# Patient Record
Sex: Female | Born: 1965 | Race: Black or African American | Hispanic: No | Smoking: Never smoker
Health system: Southern US, Community
[De-identification: ages and names within clinical notes are randomized; demographics above are authoritative.]

## PROBLEM LIST (undated history)

## (undated) DIAGNOSIS — I10 Essential (primary) hypertension: Secondary | ICD-10-CM

## (undated) DIAGNOSIS — E785 Hyperlipidemia, unspecified: Secondary | ICD-10-CM

## (undated) DIAGNOSIS — E119 Type 2 diabetes mellitus without complications: Secondary | ICD-10-CM

## (undated) HISTORY — DX: Hyperlipidemia, unspecified: E78.5

## (undated) HISTORY — DX: Essential (primary) hypertension: I10

## (undated) HISTORY — DX: Type 2 diabetes mellitus without complications: E11.9

## (undated) HISTORY — PX: TONSILLECTOMY: SUR1361

---

## 1998-11-13 DIAGNOSIS — I10 Essential (primary) hypertension: Secondary | ICD-10-CM

## 1998-11-13 HISTORY — DX: Essential (primary) hypertension: I10

## 2005-03-02 ENCOUNTER — Ambulatory Visit: Payer: Self-pay | Admitting: Internal Medicine

## 2006-07-30 ENCOUNTER — Ambulatory Visit: Payer: Self-pay | Admitting: Internal Medicine

## 2007-09-04 ENCOUNTER — Ambulatory Visit: Payer: Self-pay | Admitting: Internal Medicine

## 2008-09-08 ENCOUNTER — Ambulatory Visit: Payer: Self-pay | Admitting: Internal Medicine

## 2009-12-30 ENCOUNTER — Ambulatory Visit: Payer: Self-pay | Admitting: Internal Medicine

## 2011-01-24 ENCOUNTER — Ambulatory Visit: Payer: Self-pay | Admitting: Internal Medicine

## 2012-05-28 ENCOUNTER — Ambulatory Visit: Payer: Self-pay | Admitting: Internal Medicine

## 2013-07-28 ENCOUNTER — Ambulatory Visit: Payer: Self-pay | Admitting: Internal Medicine

## 2013-08-01 ENCOUNTER — Ambulatory Visit: Payer: Self-pay | Admitting: Internal Medicine

## 2013-11-09 IMAGING — MG MM ADDITIONAL VIEWS AT NO CHARGE
2 series · 6 of 6 positions shown · non-contrast
Comparison: 07/28/2013

REASON FOR EXAM: LT BRST CALCIFICATIONS
COMMENTS:

PROCEDURE:     MAM - MAM DGTL ADD VW LT  SCR  - August 01, 2013  [DATE]
CLINICAL DATA: The patient returns to evaluate calcifications in
the left breast.
DIGITAL DIAGNOSTIC LEFT MAMMOGRAM WITH CAD THE

[L ML · left · 4 of 4 slices shown]
[im 1/4]
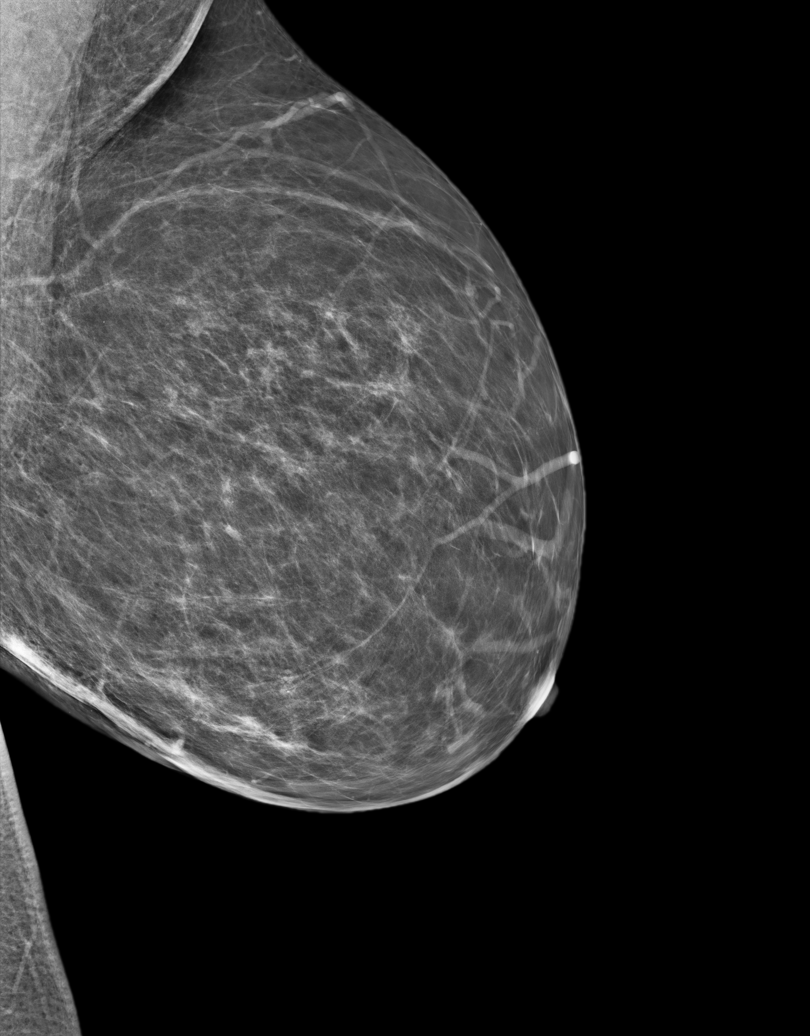
[im 2/4]
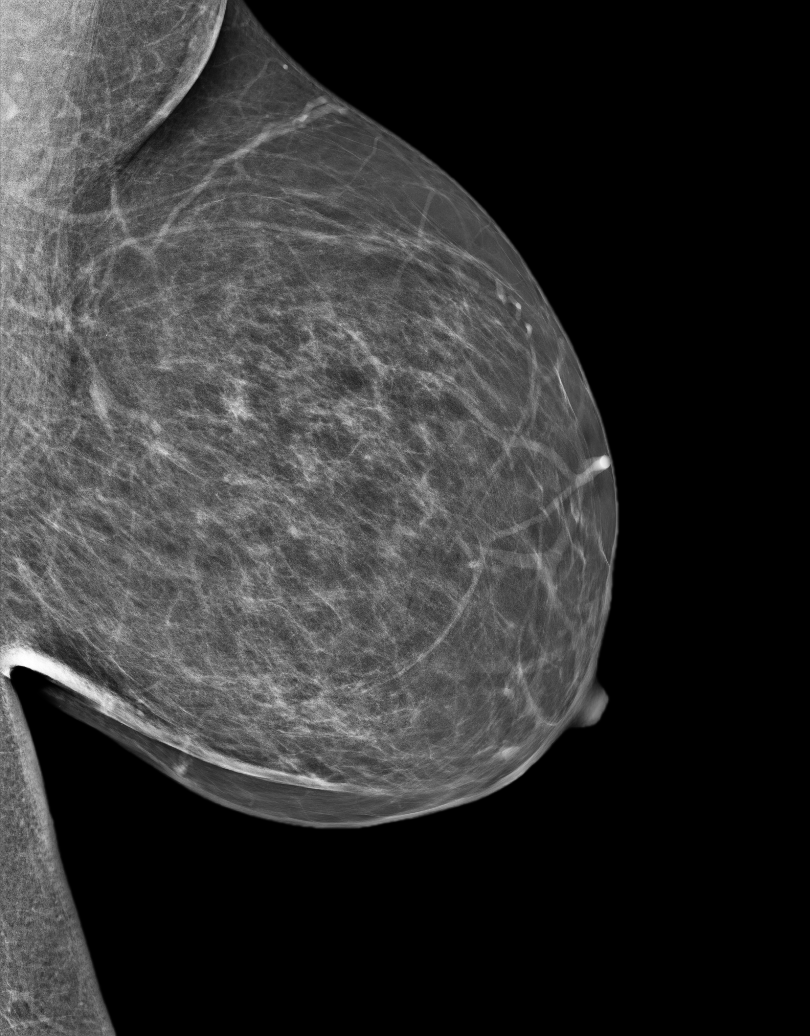
[im 3/4]
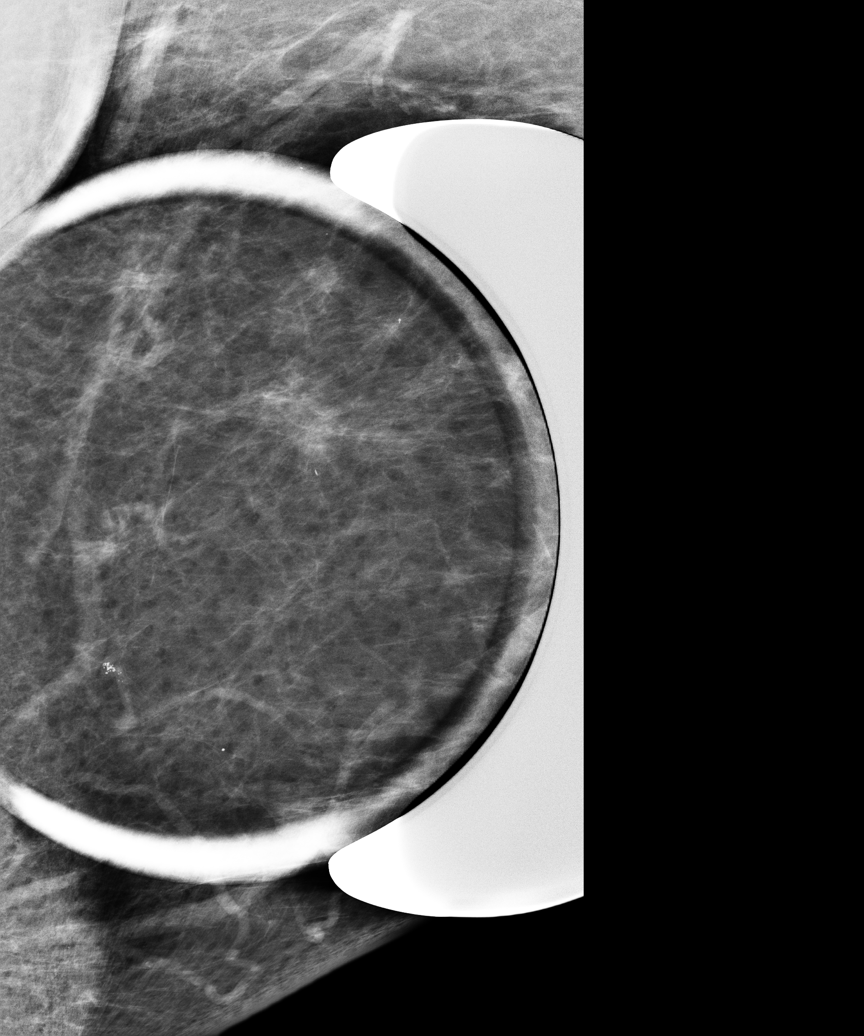
[im 4/4]
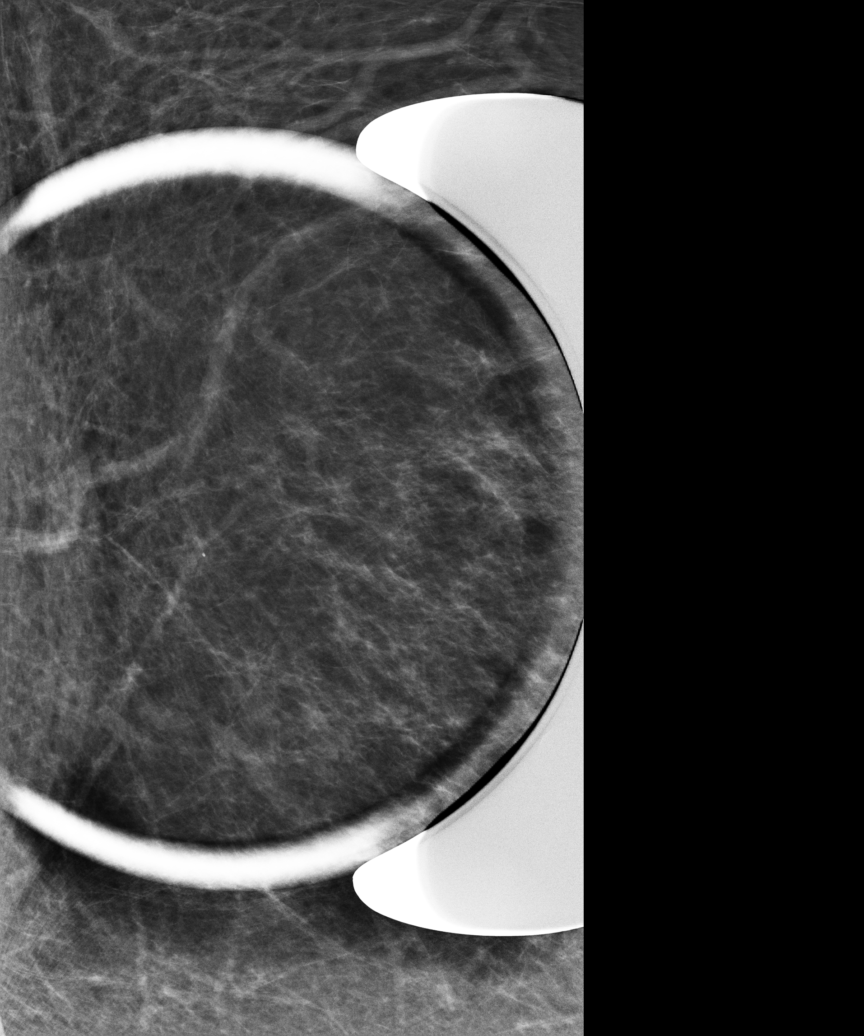

[L MLO · left · 2 of 2 slices shown]
[im 1/2]
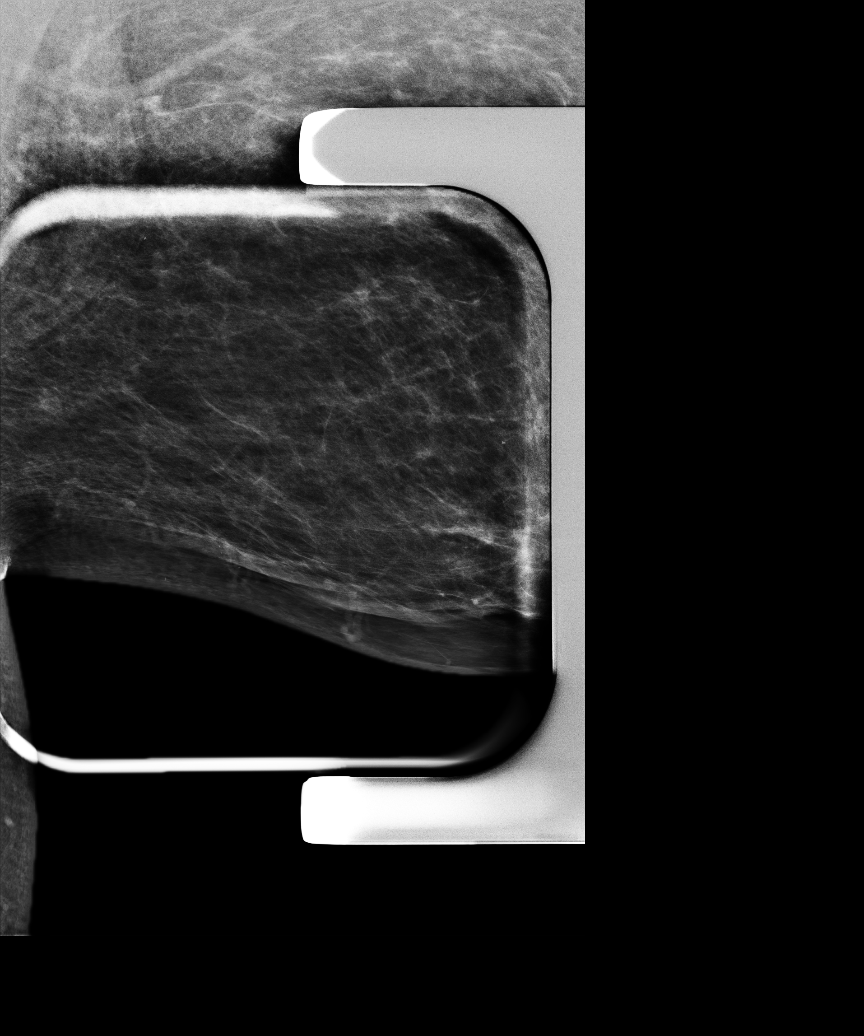
[im 2/2]
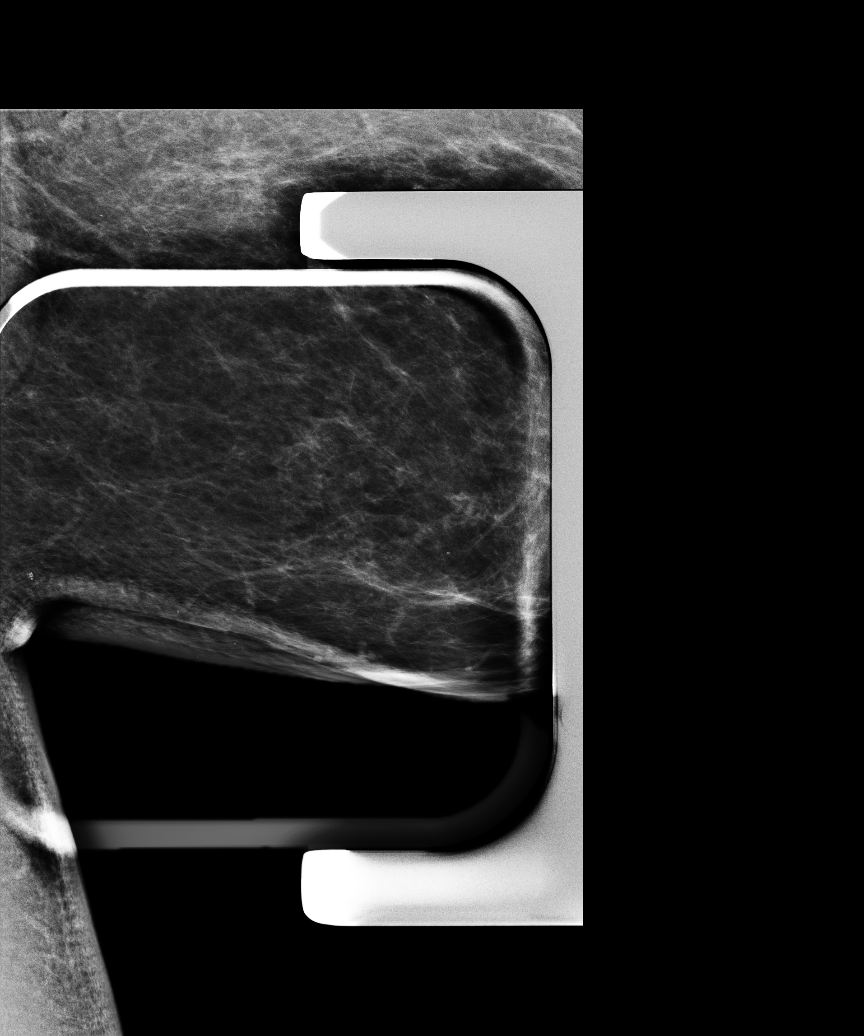

[6 of 6 positions shown; findings below may reference images not displayed]

FINDINGS: ACR Breast Density Category a:  The breast tissue is almost
entirely fatty.

Magnified views are performed of calcifications in the lower inner
quadrant of the left breast.  On these views, calcifications have
lucent centers and projects over the inframammary fold region,
consistent with dermal calcifications and consistent with benign
process.

Mammographic images were processed with CAD.
IMPRESSION: Benign calcifications in the left breast.

RECOMMENDATION:
Screening mammogram in one year. (Code:YU-3-05B)

I have discussed the findings and recommendations with the patient.
Results were also provided in writing at the conclusion of the
visit.  If applicable, a reminder letter will be sent to the
patient regarding her next appointment.

BI-RADS CATEGORY 2:  Benign finding(s).

## 2014-09-28 ENCOUNTER — Ambulatory Visit: Payer: Self-pay | Admitting: Internal Medicine

## 2014-11-13 DIAGNOSIS — E119 Type 2 diabetes mellitus without complications: Secondary | ICD-10-CM

## 2014-11-13 HISTORY — DX: Type 2 diabetes mellitus without complications: E11.9

## 2016-06-09 ENCOUNTER — Other Ambulatory Visit: Payer: Self-pay | Admitting: Internal Medicine

## 2016-06-09 DIAGNOSIS — Z1231 Encounter for screening mammogram for malignant neoplasm of breast: Secondary | ICD-10-CM

## 2016-06-14 ENCOUNTER — Telehealth: Payer: Self-pay | Admitting: Gastroenterology

## 2016-06-14 NOTE — Telephone Encounter (Signed)
Patients voicemail box is full, home phone number [phone], kept ringing with no end. I will send out a notification letter to address provided on file and try calling patient again at a later time.

## 2016-06-14 NOTE — Telephone Encounter (Signed)
colonoscopy

## 2016-06-15 NOTE — Telephone Encounter (Signed)
I have tried calling the patient multiple times with no response. I have sent out a notification letter and I will wait until the patient contacts me to schedule.

## 2016-06-23 ENCOUNTER — Ambulatory Visit: Payer: Self-pay | Attending: Internal Medicine

## 2016-07-06 ENCOUNTER — Encounter: Payer: Self-pay | Admitting: *Deleted

## 2016-07-12 ENCOUNTER — Ambulatory Visit: Payer: Self-pay | Admitting: General Surgery

## 2016-07-27 ENCOUNTER — Encounter: Payer: Self-pay | Admitting: *Deleted

## 2016-08-21 ENCOUNTER — Encounter: Payer: Self-pay | Admitting: *Deleted

## 2016-08-22 ENCOUNTER — Encounter: Payer: Self-pay | Admitting: General Surgery

## 2016-08-22 ENCOUNTER — Ambulatory Visit
Admission: RE | Admit: 2016-08-22 | Discharge: 2016-08-22 | Disposition: A | Discharge status: Home / Self Care | Payer: BC Managed Care – PPO | Source: Ambulatory Visit | Attending: Internal Medicine | Admitting: Internal Medicine

## 2016-08-22 ENCOUNTER — Ambulatory Visit (INDEPENDENT_AMBULATORY_CARE_PROVIDER_SITE_OTHER): Payer: BC Managed Care – PPO | Admitting: General Surgery

## 2016-08-22 ENCOUNTER — Other Ambulatory Visit: Payer: Self-pay | Admitting: Internal Medicine

## 2016-08-22 VITALS — BP 158/92 | HR 86 | Resp 16 | Ht 63.0 in | Wt 212.0 lb

## 2016-08-22 DIAGNOSIS — Z1211 Encounter for screening for malignant neoplasm of colon: Secondary | ICD-10-CM | POA: Diagnosis not present

## 2016-08-22 DIAGNOSIS — Z1231 Encounter for screening mammogram for malignant neoplasm of breast: Secondary | ICD-10-CM | POA: Diagnosis present

## 2016-08-22 DIAGNOSIS — Z8639 Personal history of other endocrine, nutritional and metabolic disease: Secondary | ICD-10-CM | POA: Diagnosis not present

## 2016-08-22 MED ORDER — POLYETHYLENE GLYCOL 3350 17 GM/SCOOP PO POWD: 1.0000 | Freq: Once | ORAL | 0 refills | Status: AC

## 2016-08-22 NOTE — Progress Notes (Signed)
Patient ID: Tasha SchwartzMarion M Laubacher, female   DOB: [birth year], 50 y.o.   MRN: [id number]  Chief Complaint  Patient presents with  . Colonoscopy    HPI Tasha Phillips is a 50 y.o. female here today for a evaluation of a screening colonoscopy. Patient states no GI problems at this time. Bowels move 4 times daily and no bleeding. Average blood glucose When it is checked at home is 300.   She states she has recently changed her eating habits and walking more within the past 2 weeks.  She does admit to  nausea without abdominal pain and frequent loose stools since she started metformin.    HPI  Past Medical History:  Diagnosis Date  . Diabetes mellitus without complication (HCC) 2016  . Hyperlipidemia   . Hypertension 2000    Past Surgical History:  Procedure Laterality Date  . CESAREAN SECTION    . TONSILLECTOMY      Family History  Problem Relation Age of Onset  . Colon cancer Maternal Grandmother     Social History Social History  Substance Use Topics  . Smoking status: Never Smoker  . Smokeless tobacco: Never Used  . Alcohol use No    No Known Allergies  Current Outpatient Prescriptions  Medication Sig Dispense Refill  . atorvastatin (LIPITOR) 20 MG tablet Take 20 mg by mouth daily at 6 PM.     . diltiazem (CARDIZEM CD) 180 MG 24 hr capsule Take 360 mg by mouth daily.     Marland Kitchen. lisinopril (PRINIVIL,ZESTRIL) 20 MG tablet Take 20 mg by mouth daily.     . metFORMIN (GLUCOPHAGE) 500 MG tablet Take 500 mg by mouth 2 (two) times daily with a meal.    . ONE TOUCH ULTRA TEST test strip     . triamterene-hydrochlorothiazide (MAXZIDE-25) 37.5-25 MG tablet Take 1 tablet by mouth daily.     . polyethylene glycol powder (GLYCOLAX/MIRALAX) powder Take 255 g by mouth once. Mix whole container with 64 ounces of clear liquids 255 g 0   No current facility-administered medications for this visit.     Review of Systems Review of Systems  Constitutional: Negative.   Respiratory: Negative.    Cardiovascular: Negative.   Gastrointestinal: Positive for nausea.    Blood pressure (!) 158/92, pulse 86, resp. rate 16, height 5\' 3"  (1.6 m), weight 212 lb (96.2 kg).  Physical Exam Physical Exam  Constitutional: She is oriented to person, place, and time. She appears well-developed and well-nourished.  HENT:  Mouth/Throat: Oropharynx is clear and moist.  Eyes: Conjunctivae are normal.  Neck: Neck supple.  Cardiovascular: Normal rate, regular rhythm and normal heart sounds.   Pulmonary/Chest: Effort normal and breath sounds normal.  Abdominal: Soft. Bowel sounds are normal. There is no tenderness.  Lymphadenopathy:    She has no cervical adenopathy.  Neurological: She is alert and oriented to person, place, and time.  Skin: Skin is warm and dry.  Psychiatric: Her behavior is normal.    Data Reviewed Office notes from Dr. Arlana Pouchate dated 06/09/2016 were reviewed.  Laboratory studies of the same day were notable for a glucose of 235, hemoglobin A1c of 9.6, modest elevation of the alkaline phosphatase and serum transaminases. Normal renal function with a creatinine of 0.8 with an estimated GFR of 94. Normal electrolytes.  Assessment    Candidate for screening colonoscopy.  Poorly controlled diabetes, new interest on the patient's part for management.    Plan    Patient was encouraged to discuss with  Dr. Arlana Pouch whether other options outside of metformin for management of her hyperglycemia might be appropriate with her reports of nausea and loose stools correlating with the use of metformin.    Colonoscopy with possible biopsy/polypectomy prn: Information regarding the procedure, including its potential risks and complications (including but not limited to perforation of the bowel, which may require emergency surgery to repair, and bleeding) was verbally given to the patient. Educational information regarding lower intestinal endoscopy was given to the patient. Written instructions  for how to complete the bowel prep using Miralax were provided. The importance of drinking ample fluids to avoid dehydration as a result of the prep emphasized.  The patient is scheduled for a Colonoscopy at [facility] on 10/04/16. They are aware to call the day before to get their arrival time. She will hold her Metformin the day of prep and procedure. She will only take her blood pressure medications the morning of at 6 am with a sip of water. Miralax prescription has been sent into the patient's pharmacy. The patient is aware of date and instructions.    This information has been scribed by Dorathy Daft RN, BSN,BC. Marland Kitchen  Earline Mayotte 08/22/2016, 5:16 PM

## 2016-08-22 NOTE — Patient Instructions (Addendum)
Colonoscopy A colonoscopy is an exam to look at the entire large intestine (colon). This exam can help find problems such as tumors, polyps, inflammation, and areas of bleeding. The exam takes about 1 hour.  LET [facility] PROVIDER KNOW ABOUT:   Any allergies you have.  All medicines you are taking, including vitamins, herbs, eye drops, creams, and over-the-counter medicines.  Previous problems you or members of your family have had with the use of anesthetics.  Any blood disorders you have.  Previous surgeries you have had.  Medical conditions you have. RISKS AND COMPLICATIONS  Generally, this is a safe procedure. However, as with any procedure, complications can occur. Possible complications include:  Bleeding.  Tearing or rupture of the colon wall.  Reaction to medicines given during the exam.  Infection (rare). BEFORE THE PROCEDURE   Ask your health care provider about changing or stopping your regular medicines.  You may be prescribed an oral bowel prep. This involves drinking a large amount of medicated liquid, starting the day before your procedure. The liquid will cause you to have multiple loose stools until your stool is almost clear or light green. This cleans out your colon in preparation for the procedure.  Do not eat or drink anything else once you have started the bowel prep, unless your health care provider tells you it is safe to do so.  Arrange for someone to drive you home after the procedure. PROCEDURE   You will be given medicine to help you relax (sedative).  You will lie on your side with your knees bent.  A long, flexible tube with a light and camera on the end (colonoscope) will be inserted through the rectum and into the colon. The camera sends video back to a computer screen as it moves through the colon. The colonoscope also releases carbon dioxide gas to inflate the colon. This helps your health care provider see the area better.  During  the exam, your health care provider may take a small tissue sample (biopsy) to be examined under a microscope if any abnormalities are found.  The exam is finished when the entire colon has been viewed. AFTER THE PROCEDURE   Do not drive for 24 hours after the exam.  You may have a small amount of blood in your stool.  You may pass moderate amounts of gas and have mild abdominal cramping or bloating. This is caused by the gas used to inflate your colon during the exam.  Ask when your test results will be ready and how you will get your results. Make sure you get your test results.   This information is not intended to replace advice given to you by your health care provider. Make sure you discuss any questions you have with your health care provider.   Document Released: 10/27/2000 Document Revised: 08/20/2013 Document Reviewed: 07/07/2013 Elsevier Interactive Patient Education [employer]2016 [employer].  The patient [facility]. They are aware to call the day before to get their arrival time. She will hold her Metformin the day of prep and procedure. She will only take her blood pressure medications the morning of at 6 am with a sip of water. Miralax prescription has been sent into the patient's pharmacy. The patient is aware of date and instructions.

## 2016-09-27 ENCOUNTER — Telehealth: Payer: Self-pay | Admitting: *Deleted

## 2016-09-27 NOTE — Telephone Encounter (Signed)
Patient was contacted today to review medications. This patient states she is not at home and will call the office back tomorrow to review.   She is presently scheduled for a [facility].   We need to make sure patient has Miralax prescription as well.

## 2016-10-02 NOTE — Telephone Encounter (Signed)
Patient was contacted again today since we did not hear back from her last week.   This patient states all medications are the same since last office visit.   We will proceed with colonoscopy as scheduled.   Patient instructed to contact the office should she have further questions.

## 2016-10-03 ENCOUNTER — Encounter: Payer: Self-pay | Admitting: *Deleted

## 2016-10-04 ENCOUNTER — Encounter
Admission: RE | Disposition: A | Discharge status: Home / Self Care | Payer: Self-pay | Source: Ambulatory Visit | Attending: General Surgery

## 2016-10-04 ENCOUNTER — Ambulatory Visit
Admission: RE | Admit: 2016-10-04 | Discharge: 2016-10-04 | Disposition: A | Discharge status: Home / Self Care | Payer: BC Managed Care – PPO | Source: Ambulatory Visit | Attending: General Surgery | Admitting: General Surgery

## 2016-10-04 ENCOUNTER — Ambulatory Visit: Payer: BC Managed Care – PPO | Admitting: Anesthesiology

## 2016-10-04 DIAGNOSIS — E785 Hyperlipidemia, unspecified: Secondary | ICD-10-CM | POA: Diagnosis not present

## 2016-10-04 DIAGNOSIS — K621 Rectal polyp: Secondary | ICD-10-CM | POA: Insufficient documentation

## 2016-10-04 DIAGNOSIS — Z7984 Long term (current) use of oral hypoglycemic drugs: Secondary | ICD-10-CM | POA: Insufficient documentation

## 2016-10-04 DIAGNOSIS — K5140 Inflammatory polyps of colon without complications: Secondary | ICD-10-CM | POA: Insufficient documentation

## 2016-10-04 DIAGNOSIS — Z79899 Other long term (current) drug therapy: Secondary | ICD-10-CM | POA: Diagnosis not present

## 2016-10-04 DIAGNOSIS — R197 Diarrhea, unspecified: Secondary | ICD-10-CM | POA: Diagnosis not present

## 2016-10-04 DIAGNOSIS — I10 Essential (primary) hypertension: Secondary | ICD-10-CM | POA: Insufficient documentation

## 2016-10-04 DIAGNOSIS — E119 Type 2 diabetes mellitus without complications: Secondary | ICD-10-CM | POA: Insufficient documentation

## 2016-10-04 HISTORY — PX: COLONOSCOPY WITH PROPOFOL: SHX5780

## 2016-10-04 LAB — GLUCOSE, CAPILLARY: GLUCOSE-CAPILLARY: 161 mg/dL — AB (ref 65–99)

## 2016-10-04 SURGERY — COLONOSCOPY WITH PROPOFOL
Anesthesia: General

## 2016-10-04 MED ORDER — FENTANYL CITRATE (PF) 100 MCG/2ML IJ SOLN
INTRAMUSCULAR | Status: DC | PRN
  Administered 2016-10-04: 50 ug via INTRAVENOUS

## 2016-10-04 MED ORDER — PHENYLEPHRINE HCL 10 MG/ML IJ SOLN
INTRAMUSCULAR | Status: DC | PRN
  Administered 2016-10-04 (×2): 100 ug via INTRAVENOUS

## 2016-10-04 MED ORDER — PROPOFOL 500 MG/50ML IV EMUL
INTRAVENOUS | Status: DC | PRN
  Administered 2016-10-04: 150 ug/kg/min via INTRAVENOUS

## 2016-10-04 MED ORDER — MIDAZOLAM HCL 5 MG/5ML IJ SOLN
INTRAMUSCULAR | Status: DC | PRN
  Administered 2016-10-04: 1 mg via INTRAVENOUS

## 2016-10-04 MED ORDER — LIDOCAINE 2% (20 MG/ML) 5 ML SYRINGE
INTRAMUSCULAR | Status: DC | PRN
  Administered 2016-10-04: 40 mg via INTRAVENOUS

## 2016-10-04 MED ORDER — PROPOFOL 10 MG/ML IV BOLUS
INTRAVENOUS | Status: DC | PRN
  Administered 2016-10-04: 100 mg via INTRAVENOUS

## 2016-10-04 MED ORDER — SODIUM CHLORIDE 0.9 % IV SOLN
INTRAVENOUS | Status: DC
  Administered 2016-10-04: 10:00:00 via INTRAVENOUS

## 2016-10-04 NOTE — Anesthesia Postprocedure Evaluation (Signed)
Anesthesia Post Note  Patient: Tasha SchwartzMarion M Phillips  Procedure(s) Performed: Procedure(s) (LRB): COLONOSCOPY WITH PROPOFOL (N/A)  Patient location during evaluation: Endoscopy Anesthesia Type: General Level of consciousness: awake and alert and oriented Pain management: pain level controlled Vital Signs Assessment: post-procedure vital signs reviewed and stable Respiratory status: spontaneous breathing, nonlabored ventilation and respiratory function stable Cardiovascular status: blood pressure returned to baseline and stable Postop Assessment: no signs of nausea or vomiting Anesthetic complications: no    Last Vitals:  Vitals:   10/04/16 1050 10/04/16 1100  BP: 101/61 101/61  Pulse: 84   Resp: 16   Temp:      Last Pain:  Vitals:   10/04/16 1040  TempSrc: Tympanic                 [name]

## 2016-10-04 NOTE — H&P (Signed)
Leonard SchwartzMarion M Schlagel [id number] [birth year]     HPI: Negative for screening colonoscopy.  Patient tolerated the prep well.  She did meet with her primary M.D. to discuss medication changes to obtain better control of her blood sugars as requested.  Prescriptions Prior to Admission  Medication Sig Dispense Refill Last Dose  . atorvastatin (LIPITOR) 20 MG tablet Take 20 mg by mouth daily at 6 PM.    10/03/2016 at Unknown time  . diltiazem (CARDIZEM CD) 180 MG 24 hr capsule Take 360 mg by mouth daily.    10/03/2016 at Unknown time  . lisinopril (PRINIVIL,ZESTRIL) 20 MG tablet Take 20 mg by mouth daily.    10/03/2016 at Unknown time  . metFORMIN (GLUCOPHAGE) 500 MG tablet Take 500 mg by mouth 2 (two) times daily with a meal.   10/02/2016 at Unknown time  . triamterene-hydrochlorothiazide (MAXZIDE-25) 37.5-25 MG tablet Take 1 tablet by mouth daily.    10/03/2016 at Unknown time  . ONE TOUCH ULTRA TEST test strip    Taking   No Known Allergies Past Medical History:  Diagnosis Date  . Diabetes mellitus without complication (HCC) 2016  . Hyperlipidemia   . Hypertension 2000   Past Surgical History:  Procedure Laterality Date  . CESAREAN SECTION    . TONSILLECTOMY     Social History   Social History  . Marital status: Divorced    Spouse name: N/A  . Number of children: N/A  . Years of education: N/A   Occupational History  . Not on file.   Social History Main Topics  . Smoking status: Never Smoker  . Smokeless tobacco: Never Used  . Alcohol use No  . Drug use: No  . Sexual activity: Not on file   Other Topics Concern  . Not on file   Social History Narrative  . No narrative on file   Social History   Social History Narrative  . No narrative on file     ROS: Negative.     PE: HEENT: Negative. Lungs: Clear. Cardio: RR.  Assessment/Plan:  Proceed with planned endoscopy.   Earline MayotteByrnett, Tavon Magnussen W 10/04/2016   Assessment/Plan:  Proceed with planned  endoscopy.

## 2016-10-04 NOTE — Op Note (Signed)
[facility], [facility] Gastroenterology Patient Name: Tasha Phillips Procedure Date: 10/04/2016 10:08 AM MRN: [id number] Account #: [id number] Date of Birth: 07-24-1966 Admit Type: Outpatient Age: 50 [facility] ROOM 1 Gender: Female Note Status: Finalized Procedure:            Colonoscopy Indications:          Diarrhea Providers:            Earline MayotteJeffrey W. Juniper Cobey, MD Referring MD:         Jillene Bucksenny C. Arlana Pouchate, MD (Referring MD) Medicines:            Monitored Anesthesia Care Complications:        No immediate complications. Procedure:            Pre-Anesthesia Assessment:                       - Prior to the procedure, a History and Physical was                        performed, and patient medications, allergies and                        sensitivities were reviewed. The patient's tolerance of                        previous anesthesia was reviewed.                       - The risks and benefits of the procedure and the                        sedation options and risks were discussed with the                        patient. All questions were answered and informed                        consent was obtained.                       After obtaining informed consent, the colonoscope was                        passed under direct vision. Throughout the procedure,                        the patient's blood pressure, pulse, and oxygen                        saturations were monitored continuously. The Olympus                        PCF-H180AL colonoscope ( S#: [id number] ) was introduced                        through the anus and advanced to the the terminal                        ileum. The colonoscopy was somewhat difficult due to  significant looping. Successful completion of the                        procedure was aided by using manual pressure. The                        patient tolerated the procedure well. The quality of                        the bowel  preparation was excellent. Findings:      Three sessile polyps were found in the rectum. The polyps were 5 mm in       size. These were biopsied with a cold forceps for histology.      The terminal ileum contained diffuse pseudopolyps. This was biopsied       with a cold forceps for histology.      Random biopsies of the right and left colon were completed. Impression:           - Three 5 mm polyps in the rectum. Biopsied.                       - Pseudopolyps in the terminal ileum. [Resected].                        Biopsied. Recommendation:       - Telephone endoscopist for pathology results in 1 week. Procedure Code(s):    --- Professional ---                       [id number], Colonoscopy, flexible; with biopsy, single or                        multiple Diagnosis Code(s):    --- Professional ---                       R19.7, Diarrhea, unspecified                       K51.40, Inflammatory polyps of colon without                        complications                       K62.1, Rectal polyp CPT copyright 2016 American Medical Association. All rights reserved. The codes documented in this report are preliminary and upon coder review may  be revised to meet current compliance requirements. Earline MayotteJeffrey W. Jasime Westergren, MD 10/04/2016 10:45:00 AM This report has been signed electronically. Number of Addenda: 0 Note Initiated On: 10/04/2016 10:08 AM Scope Withdrawal Time: 0 hours 15 minutes 0 seconds  Total Procedure Duration: 0 hours 26 minutes 49 seconds       [facility]

## 2016-10-04 NOTE — Anesthesia Preprocedure Evaluation (Addendum)
Anesthesia Evaluation  Patient identified by MRN, date of birth, ID band Patient awake    Reviewed: Allergy & Precautions, NPO status , Patient's Chart, lab work & pertinent test results  History of Anesthesia Complications Negative for: history of anesthetic complications  Airway Mallampati: III  TM Distance: >3 FB Neck ROM: Full    Dental no notable dental hx.    Pulmonary neg pulmonary ROS, neg sleep apnea, neg COPD,    breath sounds clear to auscultation- rhonchi (-) wheezing      Cardiovascular hypertension, Pt. on medications (-) CAD, (-) Past MI and (-) Cardiac Stents  Rhythm:Regular Rate:Normal - Systolic murmurs and - Diastolic murmurs    Neuro/Psych negative neurological ROS  negative psych ROS   GI/Hepatic negative GI ROS, Neg liver ROS,   Endo/Other  diabetes, Type 2, Oral Hypoglycemic Agents  Renal/GU negative Renal ROS     Musculoskeletal negative musculoskeletal ROS (+)   Abdominal (+) + obese,   Peds  Hematology negative hematology ROS (+)   Anesthesia Other Findings Past Medical History: 2016: Diabetes mellitus without complication (HCC) No date: Hyperlipidemia 2000: Hypertension   Reproductive/Obstetrics                           Anesthesia Physical Anesthesia Plan  ASA: II  Anesthesia Plan: General   Post-op Pain Management:    Induction: Intravenous  Airway Management Planned: Natural Airway  Additional Equipment:   Intra-op Plan:   Post-operative Plan:   Informed Consent: I have reviewed the patients History and Physical, chart, labs and discussed the procedure including the risks, benefits and alternatives for the proposed anesthesia with the patient or authorized representative who has indicated his/her understanding and acceptance.   Dental advisory given  Plan Discussed with: CRNA and Anesthesiologist  Anesthesia Plan Comments:          Anesthesia Quick Evaluation

## 2016-10-04 NOTE — Transfer of Care (Signed)
Immediate Anesthesia Transfer of Care Note  Patient: Tasha SchwartzMarion M Territo  Procedure(s) Performed: Procedure(s): COLONOSCOPY WITH PROPOFOL (N/A)  Patient Location: Endoscopy Unit  Anesthesia Type:General  Level of Consciousness: awake and alert   Airway & Oxygen Therapy: Patient Spontanous Breathing and Patient connected to nasal cannula oxygen  Post-op Assessment: Report given to RN and Post -op Vital signs reviewed and stable  Post vital signs: Reviewed and stable  Last Vitals:  Vitals:   10/04/16 1050 10/04/16 1100  BP: 101/61 101/61  Pulse: 84   Resp: 16   Temp:      Last Pain:  Vitals:   10/04/16 1040  TempSrc: Tympanic         Complications: No apparent anesthesia complications

## 2016-10-07 LAB — SURGICAL PATHOLOGY

## 2016-10-09 ENCOUNTER — Encounter: Payer: Self-pay | Admitting: General Surgery

## 2016-10-09 ENCOUNTER — Telehealth: Payer: Self-pay

## 2016-10-09 NOTE — Telephone Encounter (Signed)
Notified patient as instructed, patient pleased. Discussed follow-up appointments, patient agrees. Patient placed in recalls for 10 years.

## 2016-10-09 NOTE — Telephone Encounter (Signed)
-----   Message from Earline MayotteJeffrey W Byrnett, MD sent at 10/08/2016  8:33 PM EST ----- Please notify all biopsies fine. Repeat exam in 10 years.  ----- Message ----- From: Interface, Lab In Three Zero One Sent: 10/07/2016   6:14 PM To: Earline MayotteJeffrey W Byrnett, MD

## 2017-09-26 ENCOUNTER — Other Ambulatory Visit: Payer: Self-pay | Admitting: Internal Medicine

## 2017-09-26 DIAGNOSIS — Z1231 Encounter for screening mammogram for malignant neoplasm of breast: Secondary | ICD-10-CM

## 2017-10-26 ENCOUNTER — Encounter: Payer: Self-pay | Admitting: Dietician

## 2017-10-26 ENCOUNTER — Encounter: Payer: BC Managed Care – PPO | Attending: Internal Medicine | Admitting: Dietician

## 2017-10-26 VITALS — Ht 63.0 in | Wt 224.7 lb

## 2017-10-26 DIAGNOSIS — E119 Type 2 diabetes mellitus without complications: Secondary | ICD-10-CM | POA: Diagnosis not present

## 2017-10-26 DIAGNOSIS — Z6839 Body mass index (BMI) 39.0-39.9, adult: Secondary | ICD-10-CM

## 2017-10-26 DIAGNOSIS — E785 Hyperlipidemia, unspecified: Secondary | ICD-10-CM

## 2017-10-26 NOTE — Progress Notes (Signed)
Medical Nutrition Therapy: Visit start time: 1330  end time: 1430  Assessment:  Diagnosis: DM type II Past medical history: HTN, HLD, obesity Psychosocial issues/ stress concerns: none Preferred learning method:  . Auditory . Visual  Current weight: 224.7lb  Height: _0  Medications, supplements: Metformin, Atorvastatin, Lisinopril, Diltiazem, Januvia, HCTZ  Progress and evaluation: Per patient report, dx with DM type II last year and has not had any formal DM education on how to manage the disease from a dietary perspective. HgbA1c 8.3, GLU 150, TG 160 (09/20/17). Reports BG readings in the 200s when she wakes up and does not typically check her sugar at any other point in the day. Has done some research online, resulting in her cutting out several carbohydrate sources such as breads, pastas, rice and potatoes. She has been drinking more water and less sweet tea, eating more salads, and "meal prepping" more often. Does not eat fried foods. She has started to add a snack after work which prevents her from over-eating at dinner.  Physical activity: Walking 3x/wk for at least 41mn. Got up to 3 miles a day but recently stopped d/t weather. Plans to join [employer] the next month.  Dietary Intake:  Usual eating pattern includes 3 meals and 2 snacks per day. Dining out frequency: 2-3 meals per week.  Breakfast: 2 boiled or fried eggs + 2 slices bacon Snack: apple Lunch: salad + cup of soup or chicken + vegetables Snack: apple + peanut butter or a different fruit Supper: green beans, grilled/baked chicken or pork, salad, cream potatoes, rice, peas, collard or turnip greens, mac n' cheese Snack: n/a Beverages: coffee + creamer and non-caloric sweetener, water, water with lemon  Nutrition Care Education: Topics covered: Basic nutrition: basic food groups, appropriate nutrient balance, appropriate meal and snack schedule, general nutrition guidelines Advanced nutrition: recipe  modification, dining out, food label reading Diabetes:  goals for BGs, appropriate meal and snack schedule, Carb counting, appropriate carb intake and balance, role of fiber Hypertension: identifying high sodium foods Hyperlipidemia: healthy and unhealthy fats, role of fiber Other lifestyle changes:  benefits of making changes, increasing motivation, readiness for change, identifying habits that need to change  Nutritional Diagnosis:  [place]-2.1 Inpaired nutrition utilization As related to dx of DM type II.  As evidenced by HgbA1c 8.3 and patient report of BG readings in the 200s. [place]-3.3 Overweight/obesity As related to lifestyle habits.  As evidenced by BMI 39.8.  Intervention: Discussion as noted above. Patient plans to start using the carbohydrate counting system to help better manage her DM type II. Along with this, she will start to monitor portions more closely and start to read nutrition facts labels more often. She will put more focus on fiber-rich foods and will continue to incorporate snacks between meals to manage both hunger and BG control. She plans to keep a food and blood glucose reading diary which she will bring to the next appointment should she choose to schedule one. Contact information was provided for patient to reach out with questions and/or schedule a follow up appointment.   Education Materials given:  . General diet guidelines for Diabetes . Food lists/ Planning A Balanced Meal . Carb counting card with individualized daily carb allotment  . Goals/ instructions  Learner/ who was taught:  . Patient  Level of understanding: .Marland KitchenVerbalizes/ demonstrates competency  Demonstrated degree of understanding via:   Teach back Learning barriers: . None  Willingness to learn/ readiness for change: . Eager, change in  progress  Monitoring and Evaluation:  Dietary intake, exercise, HgbA1c / blood sugars, and body weight      follow up: prn

## 2017-12-12 ENCOUNTER — Ambulatory Visit
Admission: RE | Admit: 2017-12-12 | Discharge: 2017-12-12 | Disposition: A | Discharge status: Home / Self Care | Payer: BC Managed Care – PPO | Source: Ambulatory Visit | Attending: Internal Medicine | Admitting: Internal Medicine

## 2017-12-12 DIAGNOSIS — Z1231 Encounter for screening mammogram for malignant neoplasm of breast: Secondary | ICD-10-CM

## 2018-03-18 ENCOUNTER — Encounter: Payer: Self-pay | Admitting: Obstetrics and Gynecology

## 2018-03-18 ENCOUNTER — Ambulatory Visit (INDEPENDENT_AMBULATORY_CARE_PROVIDER_SITE_OTHER): Payer: BC Managed Care – PPO | Admitting: Obstetrics and Gynecology

## 2018-03-18 VITALS — BP 120/78 | HR 93 | Ht 63.0 in | Wt 221.0 lb

## 2018-03-18 DIAGNOSIS — R87610 Atypical squamous cells of undetermined significance on cytologic smear of cervix (ASC-US): Secondary | ICD-10-CM

## 2018-03-18 NOTE — Progress Notes (Signed)
   GYNECOLOGY CLINIC COLPOSCOPY PROCEDURE NOTE  52 y.o. G3P3 here for colposcopy for atypical squamous cellularity of undetermined significance (ASCUS)  pap smear on 02/14/2018. 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  02/14/18 ASCUS 08/24/16 NIL 04/01/15 ASCUS 03/18/14 NIL 02/11/13 NIL 09/23/12 ASCUS  Is the patient  pregnant: No LMP: No LMP recorded. Patient is postmenopausal. Smoking status:  reports that she has never smoked. She has never used smokeless tobacco. Contraception: post menopausal status  Patient given informed consent, signed copy in the chart, time out was performed.  The patient was position in dorsal lithotomy position. Speculum was placed the cervix was visualized.   After application of acetic acid colposcopic inspection of the cervix was undertaken.   Colposcopy adequate, full visualization of transformation zone: Yes Several small hyperemic lesions noted; corresponding biopsies obtained 12 O CLock, 9 o clock, and 4 O clock biopsies  ECC specimen obtained:  Yes  All specimens were labeled and sent to pathology.   Patient was given post procedure instructions.  Will follow up pathology and manage accordingly.  Routine preventative health maintenance measures emphasized.      Vena Austria, MD, [facility], [facility]

## 2018-03-20 LAB — PATHOLOGY

## 2018-12-05 ENCOUNTER — Other Ambulatory Visit: Payer: Self-pay | Admitting: Internal Medicine

## 2018-12-05 DIAGNOSIS — Z1231 Encounter for screening mammogram for malignant neoplasm of breast: Secondary | ICD-10-CM

## 2018-12-26 ENCOUNTER — Inpatient Hospital Stay: Admission: RE | Admit: 2018-12-26 | Payer: BC Managed Care – PPO | Source: Ambulatory Visit

## 2019-01-08 ENCOUNTER — Ambulatory Visit
Admission: RE | Admit: 2019-01-08 | Discharge: 2019-01-08 | Disposition: A | Discharge status: Home / Self Care | Payer: BC Managed Care – PPO | Source: Ambulatory Visit | Attending: Internal Medicine | Admitting: Internal Medicine

## 2019-01-08 DIAGNOSIS — Z1231 Encounter for screening mammogram for malignant neoplasm of breast: Secondary | ICD-10-CM

## 2019-12-02 ENCOUNTER — Ambulatory Visit: Payer: BC Managed Care – PPO | Attending: Internal Medicine

## 2019-12-02 DIAGNOSIS — Z20822 Contact with and (suspected) exposure to covid-19: Secondary | ICD-10-CM

## 2019-12-03 LAB — NOVEL CORONAVIRUS, NAA: SARS-CoV-2, NAA: NOT DETECTED

## 2019-12-29 ENCOUNTER — Other Ambulatory Visit: Payer: Self-pay | Admitting: Internal Medicine

## 2019-12-29 DIAGNOSIS — Z1231 Encounter for screening mammogram for malignant neoplasm of breast: Secondary | ICD-10-CM

## 2020-01-10 ENCOUNTER — Ambulatory Visit: Payer: BC Managed Care – PPO | Attending: Internal Medicine

## 2020-01-10 ENCOUNTER — Other Ambulatory Visit: Payer: Self-pay

## 2020-01-10 ENCOUNTER — Ambulatory Visit: Payer: BC Managed Care – PPO

## 2020-01-10 DIAGNOSIS — Z23 Encounter for immunization: Secondary | ICD-10-CM | POA: Insufficient documentation

## 2020-01-10 NOTE — Progress Notes (Signed)
   Covid-19 Vaccination Clinic  Name:  GENESSIS FLANARY    MRN: [id number] DOB: 03-12-1966  01/10/2020  Ms. Winship was observed post Covid-19 immunization for 15 minutes without incidence. She was provided with Vaccine Information Sheet and instruction to access the V-Safe system.   Ms. Edgecombe was instructed to call 911 with any severe reactions post vaccine: Marland Kitchen Difficulty breathing  . Swelling of your face and throat  . A fast heartbeat  . A bad rash all over your body  . Dizziness and weakness    Immunizations Administered    Name Date Dose VIS Date Route   Moderna COVID-19 Vaccine 01/10/2020  2:49 PM 0.5 mL 10/14/2019 Intramuscular   Manufacturer: Moderna   Lot: [id number]   NDC: 98421-031-28

## 2020-01-21 ENCOUNTER — Ambulatory Visit
Admission: RE | Admit: 2020-01-21 | Discharge: 2020-01-21 | Disposition: A | Discharge status: Home / Self Care | Payer: BC Managed Care – PPO | Source: Ambulatory Visit | Attending: Internal Medicine | Admitting: Internal Medicine

## 2020-01-21 DIAGNOSIS — Z1231 Encounter for screening mammogram for malignant neoplasm of breast: Secondary | ICD-10-CM

## 2020-02-07 ENCOUNTER — Ambulatory Visit: Payer: BC Managed Care – PPO | Attending: Internal Medicine

## 2020-02-07 DIAGNOSIS — Z23 Encounter for immunization: Secondary | ICD-10-CM

## 2020-02-07 NOTE — Progress Notes (Signed)
   Covid-19 Vaccination Clinic  Name:  Tasha Phillips    MRN: [id number] DOB: 29-Oct-1966  02/07/2020  [name] was observed post Covid-19 immunization for 15 minutes without incident. She was provided with Vaccine Information Sheet and instruction to access the V-Safe system.   [name] was instructed to call 911 with any severe reactions post vaccine: Marland Kitchen Difficulty breathing  . Swelling of face and throat  . A fast heartbeat  . A bad rash all over body  . Dizziness and weakness   Immunizations Administered    Name Date Dose VIS Date Route   Moderna COVID-19 Vaccine 02/07/2020 12:45 PM 0.5 mL 10/14/2019 Intramuscular   Manufacturer: Gala Murdoch   Lot: [id number]   NDC: 10258-527-78

## 2020-10-27 ENCOUNTER — Encounter: Payer: Self-pay | Admitting: Dietician

## 2020-10-27 ENCOUNTER — Encounter: Payer: BC Managed Care – PPO | Attending: Internal Medicine | Admitting: Dietician

## 2020-10-27 ENCOUNTER — Other Ambulatory Visit: Payer: Self-pay

## 2020-10-27 VITALS — Ht 63.0 in | Wt 214.7 lb

## 2020-10-27 DIAGNOSIS — E119 Type 2 diabetes mellitus without complications: Secondary | ICD-10-CM

## 2020-10-27 DIAGNOSIS — Z794 Long term (current) use of insulin: Secondary | ICD-10-CM | POA: Diagnosis not present

## 2020-10-27 DIAGNOSIS — E785 Hyperlipidemia, unspecified: Secondary | ICD-10-CM

## 2020-10-27 DIAGNOSIS — I10 Essential (primary) hypertension: Secondary | ICD-10-CM

## 2020-10-27 NOTE — Progress Notes (Signed)
Medical Nutrition Therapy: Visit start time: 1600  end time: 1715  Assessment:  Diagnosis: diabetes, HTN, hyperlipidemia  Past medical history: none Psychosocial issues/ stress concerns: pt rates stress level as "moderate" and feels "very well" about stress management skills    Preferred learning method:   Auditory  Visual  Hands-on  Current weight: 214.7 lbs  Height: 5'3" Medications, supplements: reconciled in medical record  Labs- A1c 10.6(H) 06/18/2020; A1c 6.8 on 09/27/2020  Progress and evaluation:   Pt reports she was diagnosed with diabetes about six years ago and has not received nutrition education for self management   Pt states her goal is to decrease how many medications she has to take  Pt states that if she has bread at breakfast she will avoid having any bread at lunch or dinner   Pt states she thought all carbs were bad and that she needed to avoid them all together   Physical activity: 45 min aerobics class, 2 days/week; pt recently started aerobics class and has plans to start a water aerobics class as well   Dietary Intake:  Usual eating pattern includes 3 meals and 2-3 snacks per day. Dining out frequency: 4-5 meals per week.  Breakfast: canned biscuit with bacon/sausage with cheese and cup of coffee Snack: apples/grapes/orange Lunch: salad (lettuce, boiled egg, 4+ tablespoon ranch ) with chicken/tuna; canned soup; chinese soup  Snack: same as above  Supper: sometimes fried meat with veggies; cracker barrel; [place] takeout; [place] food; [employer]   Snack: sometimes piece of fruit or PB crackers; 2-3 times a week cake  Beverages: water, 24oz half sweet tea/unsweet tea, pepsi zero or regular pepsi, coffee with cream/milk and non-nutritive sweetener   Nutrition Care Education:  Basic nutrition: basic food groups, appropriate nutrient balance, appropriate meal and snack schedule, general nutrition guidelines    Weight control: importance of low sugar and low fat  choices, portion control strategies Advanced nutrition:  cooking techniques, dining out Diabetes:  appropriate meal and snack schedule, appropriate carb intake and balance, healthy carb choices, role of fiber, protein, fat Hypertension: identifying high sodium foods, sodium free seasonings  Hyperlipidemia:  healthy and unhealthy fats, role of fiber  Nutritional Diagnosis:  [place]-1.1 Food and nutrition-related knowledge deficit As related to lack of exposure to accurate nutrition guidelines for self management of T2DM.  As evidenced by pt discussion, diet recall, and management of multiple chronic conditions.  Intervention:  Discussion and instruction as noted above.  Discussed the importance of including adequate amounts of carbs in the diet.  Answered pt questions regarding specific types of carbs and beverages.  Established goals for additional change.    Mindful snacking  Try canned fruit NOT in heavy syrup (mandarin oranges, peaches, pears, applesauce) for snacks + protein (cheese, PB)  Carb + protein at snacks    Decrease sugar-sweetened beverage consumption   Switch from sweet to unsweet tea with sugar substitute (splenda, stevia)  Switch from regular to diet soda  Drink at least 64oz water daily (add crystal light, lemon, or mio as needed)    Increase physical activity   Increase exercise classes from 2-3 times per week   150 minutes per week is recommendation    Increase fiber intake   Eat at least 3 servings of whole grains a day  Increase fruit and vegetable intake  Decrease sodium intake   Reduce sodium intake to recommended 1500mg /day   Read nutrition labels on packaged foods to monitor sodium intake   400-600 mg/meal  <  200 mg/snack   Decrease fast food frequency   Avoid processed meats (bacon, sausage)   Decrease saturated fat intake   Try more plant-based sources of protein  Limit processed meats   Switch to low fat dairy products  Education  Materials given:   NCM Heart healthy and carb consistent nutrition therapy   Sip [employer] school teacher with food lists   Goals/ instructions  Learner/ who was taught:   Patient   Level of understanding:  Verbalizes/ demonstrates competency  Demonstrated degree of understanding via:   Teach back Learning barriers:  None  Willingness to learn/ readiness for change:  Acceptance, ready for change  Monitoring and Evaluation:  Dietary intake, exercise, BGs and A1c, and body weight      follow up: February 9 at 4p

## 2020-10-27 NOTE — Patient Instructions (Signed)
·   Switch to diet/zero pepsi and unsweet tea   Portion control the carbs in your meals and snacks   2-3 servings at meals   1-2 servings at snacks   Continue with your physical activity

## 2021-01-06 ENCOUNTER — Ambulatory Visit: Payer: BC Managed Care – PPO | Admitting: Dietician

## 2021-01-12 ENCOUNTER — Telehealth: Payer: Self-pay | Admitting: Dietician

## 2021-01-12 NOTE — Telephone Encounter (Signed)
Called patient to reschedule her appointment from 01/06/21 which was cancelled by [facility] due to staff illness. Patient is unable to reschedule at this time but will do so when able to see her work schedule.

## 2021-01-31 ENCOUNTER — Encounter: Payer: Self-pay | Admitting: Dietician

## 2021-01-31 NOTE — Progress Notes (Signed)
Sent notification to referring provider that patient did not wish to reschedule her MNT follow up yet after 2 cancelled/ missed appointments by this department.

## 2021-02-03 ENCOUNTER — Other Ambulatory Visit: Payer: Self-pay | Admitting: Internal Medicine

## 2021-02-03 DIAGNOSIS — Z1231 Encounter for screening mammogram for malignant neoplasm of breast: Secondary | ICD-10-CM

## 2021-02-28 ENCOUNTER — Other Ambulatory Visit: Payer: Self-pay

## 2021-02-28 ENCOUNTER — Ambulatory Visit
Admission: RE | Admit: 2021-02-28 | Discharge: 2021-02-28 | Disposition: A | Discharge status: Home / Self Care | Payer: BC Managed Care – PPO | Source: Ambulatory Visit | Attending: Internal Medicine | Admitting: Internal Medicine

## 2021-02-28 DIAGNOSIS — Z1231 Encounter for screening mammogram for malignant neoplasm of breast: Secondary | ICD-10-CM

## 2021-06-08 IMAGING — MG MM DIGITAL SCREENING BILAT W/ TOMO AND CAD
8 series · 8 of 24 positions shown · non-contrast
Comparison: Previous exam(s).

ACR Breast Density Category a: The breast tissue is almost entirely
fatty.

CLINICAL DATA: Screening.

EXAM:
DIGITAL SCREENING BILATERAL MAMMOGRAM WITH TOMOSYNTHESIS AND CAD
TECHNIQUE: Bilateral screening digital craniocaudal and mediolateral oblique
mammograms were obtained. Bilateral screening digital breast
tomosynthesis was performed. The images were evaluated with
computer-aided detection.

[R CC synth-2D]
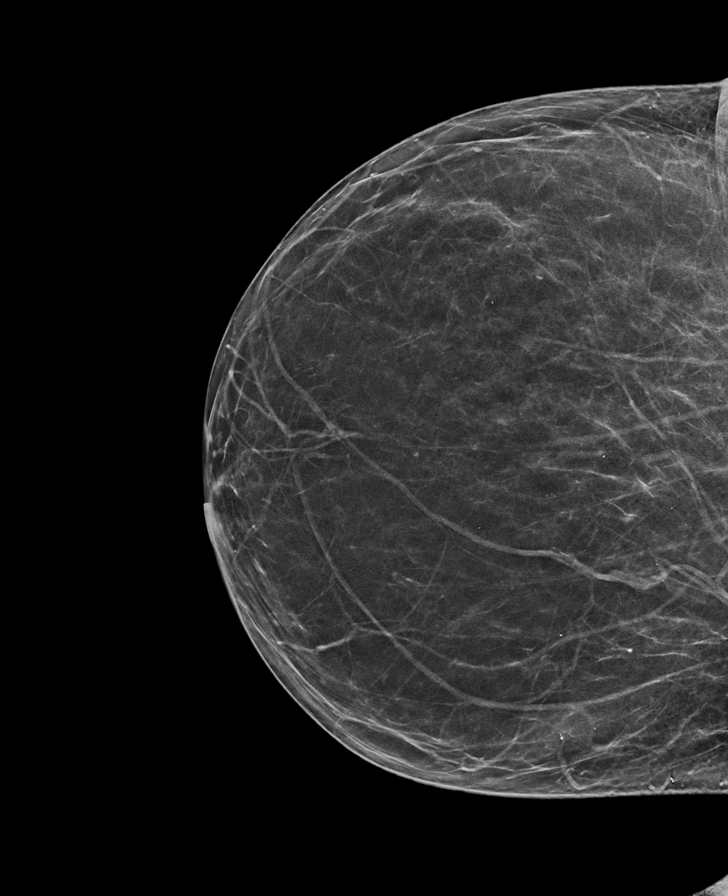

[L CC synth-2D]
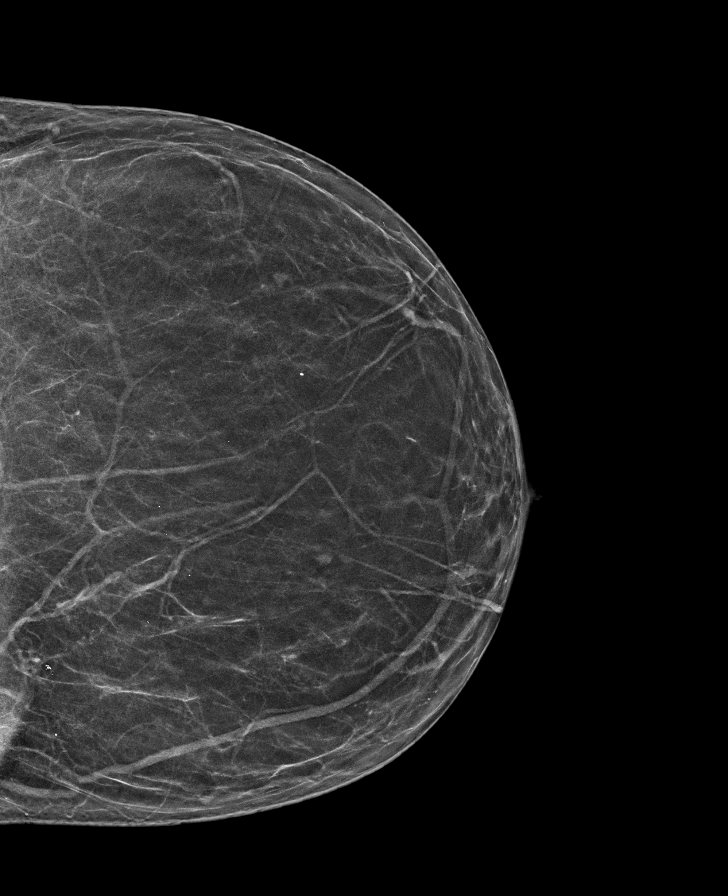

[L MLO synth-2D]
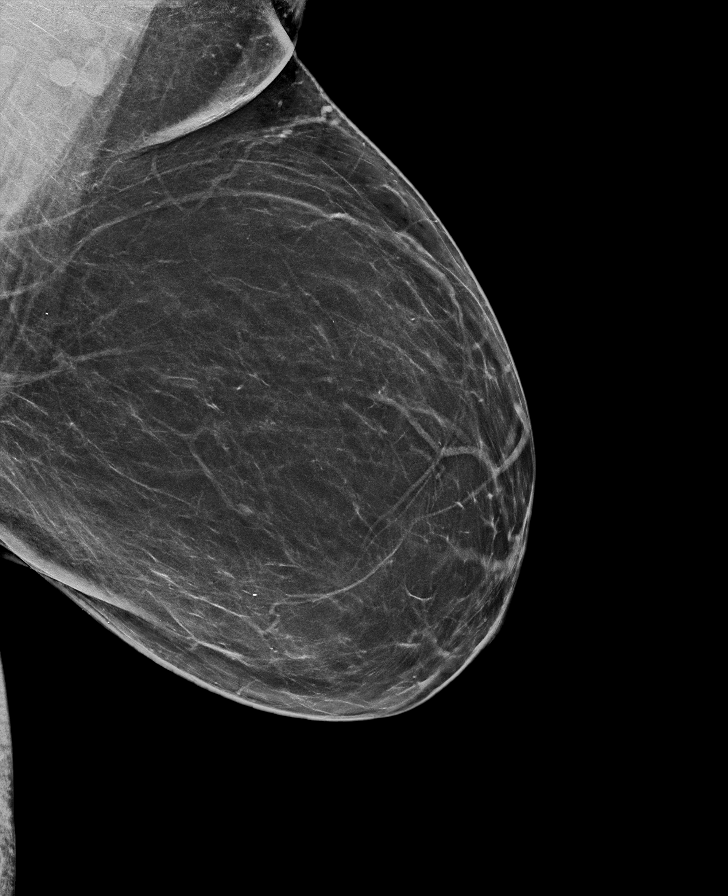

[R MLO synth-2D]
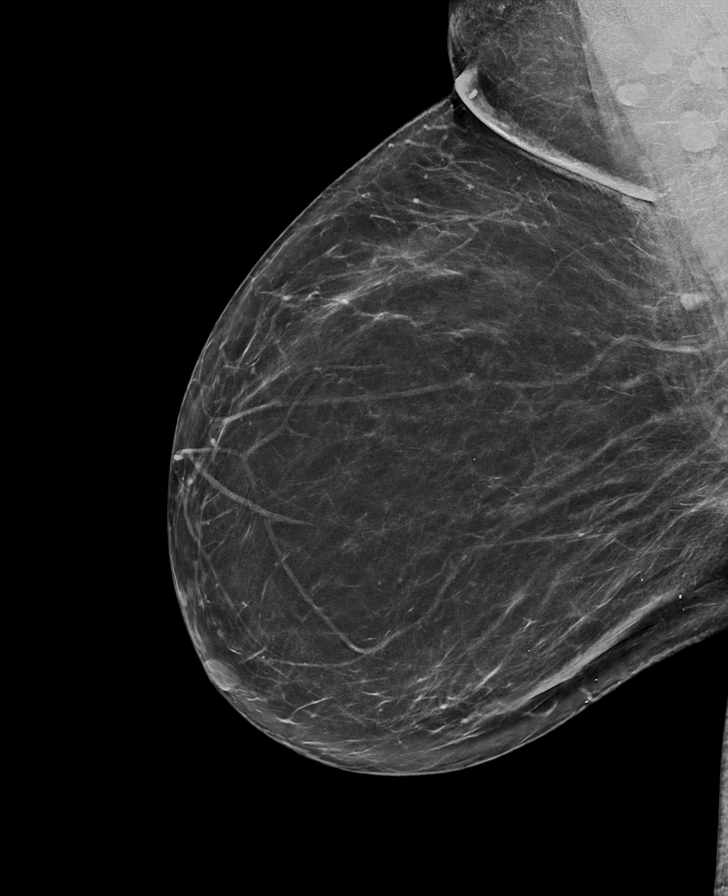

[R MLO tomo · tomo slice 39/78.0]
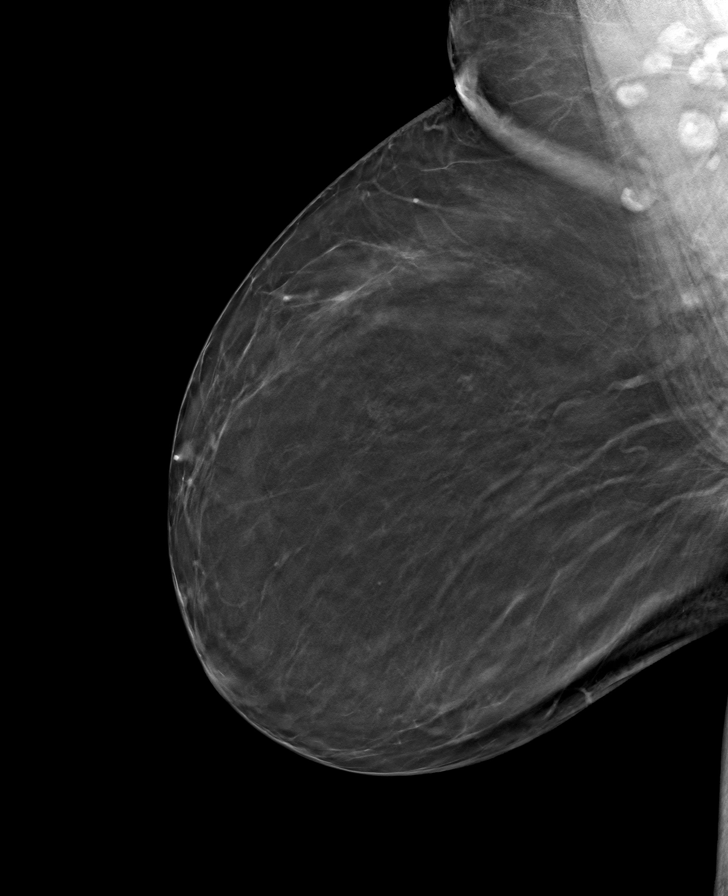

[R CC tomo · tomo slice 31/61.0]
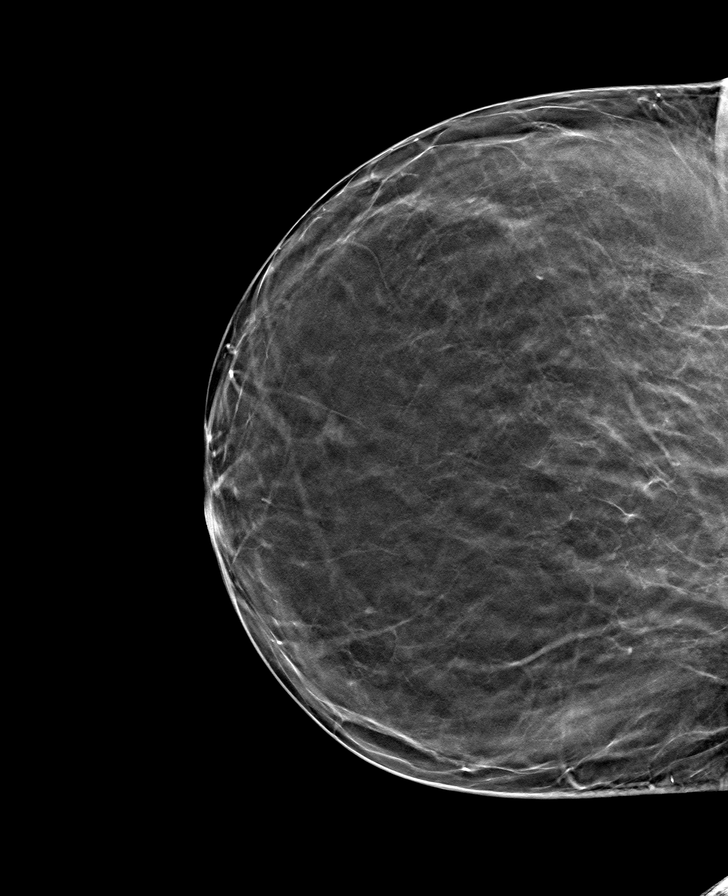

[L CC tomo · tomo slice 29/58.0]
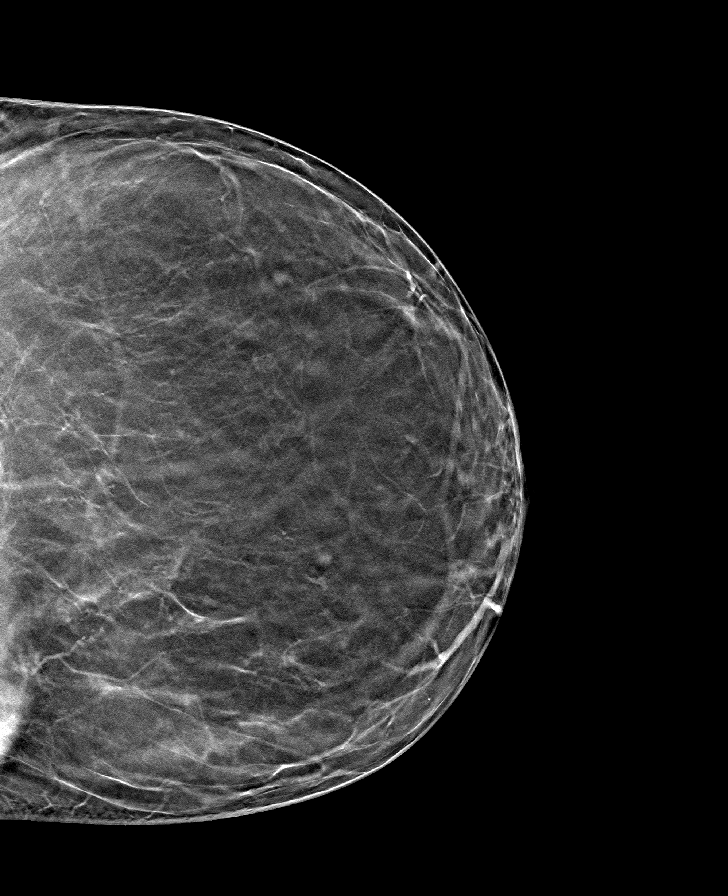

[L MLO tomo · tomo slice 39/76.0]
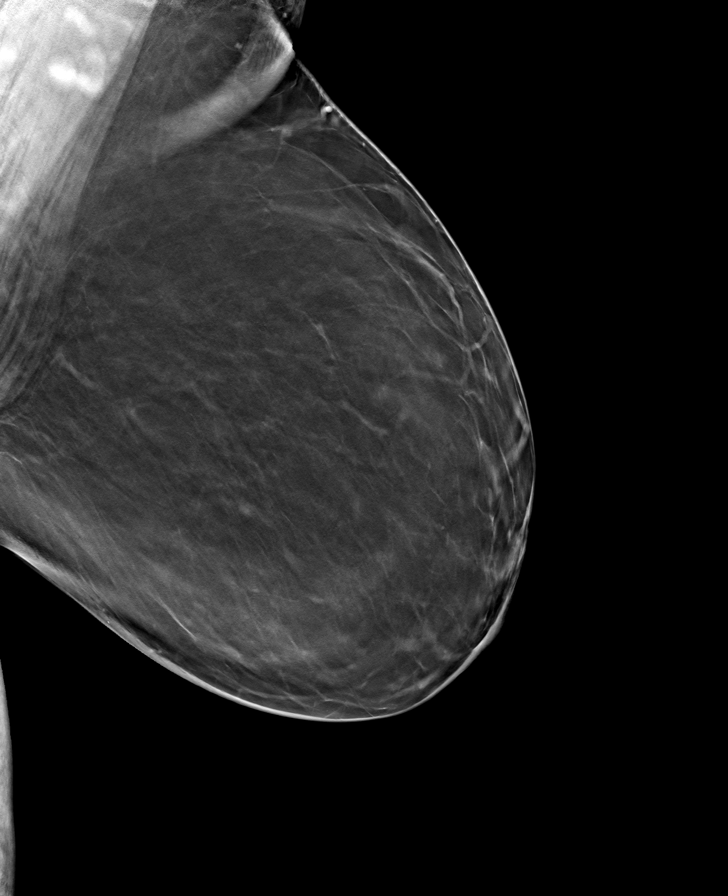

[8 of 24 positions shown; findings below may reference images not displayed]

FINDINGS: There are no findings suspicious for malignancy. The images were
evaluated with computer-aided detection.
IMPRESSION: No mammographic evidence of malignancy. A result letter of this
screening mammogram will be mailed directly to the patient.

RECOMMENDATION:
Screening mammogram in one year. (Code:[id number]-[id number])

BI-RADS CATEGORY  1: Negative.

## 2021-10-05 ENCOUNTER — Ambulatory Visit: Payer: BC Managed Care – PPO | Admitting: Dietician

## 2021-11-24 ENCOUNTER — Ambulatory Visit: Payer: BC Managed Care – PPO | Admitting: Dietician

## 2021-12-09 ENCOUNTER — Encounter: Payer: Self-pay | Admitting: Dietician

## 2021-12-09 NOTE — Progress Notes (Signed)
Have not heard back from patient to reschedule her second missed appointment from 11/24/21. Sent notification to referring provider.

## 2022-04-25 ENCOUNTER — Other Ambulatory Visit: Payer: Self-pay | Admitting: Internal Medicine

## 2022-04-25 DIAGNOSIS — Z1231 Encounter for screening mammogram for malignant neoplasm of breast: Secondary | ICD-10-CM

## 2022-05-09 ENCOUNTER — Ambulatory Visit
Admission: RE | Admit: 2022-05-09 | Discharge: 2022-05-09 | Disposition: A | Discharge status: Home / Self Care | Payer: BC Managed Care – PPO | Source: Ambulatory Visit | Attending: Internal Medicine | Admitting: Internal Medicine

## 2022-05-09 DIAGNOSIS — Z1231 Encounter for screening mammogram for malignant neoplasm of breast: Secondary | ICD-10-CM | POA: Diagnosis present

## 2023-04-11 ENCOUNTER — Other Ambulatory Visit: Payer: Self-pay | Admitting: Internal Medicine

## 2023-04-11 DIAGNOSIS — Z1231 Encounter for screening mammogram for malignant neoplasm of breast: Secondary | ICD-10-CM

## 2023-05-15 ENCOUNTER — Inpatient Hospital Stay: Admission: RE | Admit: 2023-05-15 | Payer: BC Managed Care – PPO | Source: Ambulatory Visit

## 2023-12-25 ENCOUNTER — Ambulatory Visit
Admission: RE | Admit: 2023-12-25 | Discharge: 2023-12-25 | Disposition: A | Discharge status: Home / Self Care | Payer: 59 | Source: Ambulatory Visit | Attending: Internal Medicine | Admitting: Internal Medicine

## 2023-12-25 DIAGNOSIS — Z1231 Encounter for screening mammogram for malignant neoplasm of breast: Secondary | ICD-10-CM | POA: Insufficient documentation

## 2024-02-16 ENCOUNTER — Emergency Department
Admission: EM | Admit: 2024-02-16 | Discharge: 2024-02-17 | Disposition: A | Discharge status: Home / Self Care | Attending: Emergency Medicine | Admitting: Emergency Medicine

## 2024-02-16 ENCOUNTER — Emergency Department

## 2024-02-16 ENCOUNTER — Encounter: Payer: Self-pay | Admitting: Emergency Medicine

## 2024-02-16 DIAGNOSIS — R42 Dizziness and giddiness: Secondary | ICD-10-CM | POA: Insufficient documentation

## 2024-02-16 DIAGNOSIS — E119 Type 2 diabetes mellitus without complications: Secondary | ICD-10-CM | POA: Insufficient documentation

## 2024-02-16 DIAGNOSIS — I10 Essential (primary) hypertension: Secondary | ICD-10-CM | POA: Diagnosis not present

## 2024-02-16 NOTE — ED Provider Notes (Signed)
 [facility] Provider Note    Event Date/Time   First MD Initiated Contact with Patient 02/16/24 2324     (approximate)   History   Dizziness   HPI Tasha Phillips is a 58 y.o. female with a history of hypertension and diabetes and hyperlipidemia who presents for evaluation of lightheadedness.  She said she was having a normal day and has had a normal day recently with no recent infections or other problems.  Around 7 PM she felt lightheaded and was worried that she was unsteady on her feet.  She never had a fall.  She had some nausea and 1 episode of vomiting.  She felt little bit more tired than usual today, but otherwise it was a normal day.  She and her husband said that they have both been struggling with some allergies with all of the pollen in the air and did not know if it was related to that.  They are leaving to go to the beach tomorrow and just wanted to make sure everything is okay.  She states she has not tried to walk in the last hour or so but in general she feels better.  She never had a sensation of vertigo (room spinning nor her spinning).  She had a mild headache which she attributed to the allergies.  No acute onset thunderclap headache or similar issue.  No visual disturbances.  No numbness nor weakness in her extremities.  No difficulty with speech nor comprehension.     Physical Exam   Triage Vital Signs: ED Triage Vitals [02/16/24 2204]  Encounter Vitals Group     BP 125/84     Systolic BP Percentile      Diastolic BP Percentile      Pulse Rate 88     Resp 18     Temp 98.2 F (36.8 C)     Temp Source Oral     SpO2 95 %     Weight      Height      Head Circumference      Peak Flow      Pain Score 4     Pain Loc      Pain Education      Exclude from Growth Chart     Most recent vital signs: Vitals:   02/16/24 2204  BP: 125/84  Pulse: 88  Resp: 18  Temp: 98.2 F (36.8 C)  SpO2: 95%    General: Awake, no distress.   Well-appearing.  Appropriately alert and communicative with me. CV:  Good peripheral perfusion.  Regular rate and rhythm. Resp:  Normal effort. Speaking easily and comfortably, no accessory muscle usage nor intercostal retractions.   Abd:  No distention.  Neuro:  Patient has no facial droop.  No dysarthria nor aphasia.  Normal bilateral grip strength in major muscle group strength in arms and legs.  No dysmetria in finger-to-nose testing.  Negative pronator drift, negative Romberg.  She is able to ambulate around the room with no difficulty, rotating and changing directions and continuing to ambulate with no unsteadiness of gait.   ED Results / Procedures / Treatments   Labs (all labs ordered are listed, but only abnormal results are displayed) Labs Reviewed - No data to display   EKG  ED ECG REPORT I, Loleta Rose, the attending physician, personally viewed and interpreted this ECG.  Date: 02/16/2024 EKG Time: 22: 17 Rate: 75 Rhythm: normal sinus rhythm QRS Axis: Left axis deviation  Intervals: normal ST/T Wave abnormalities: normal Narrative Interpretation: no evidence of acute ischemia    RADIOLOGY I viewed and interpreted the patient's CT head and I see no evidence of intracranial hemorrhage nor large CVA.  I also read the radiologist's report, which confirmed no acute findings.   PROCEDURES:  Critical Care performed: No  Procedures    IMPRESSION / MDM / ASSESSMENT AND PLAN / ED COURSE  I reviewed the triage vital signs and the nursing notes.                              Differential diagnosis includes, but is not limited to, allergies, acute infection, dehydration or electrolyte abnormality, CVA, intracranial hemorrhage, ACS.  Patient's presentation is most consistent with acute presentation with potential threat to life or bodily function.  Labs/studies ordered: CT head, EKG  Interventions/Medications given:  Medications - No data to display  (Note:   hospital course my include additional interventions and/or labs/studies not listed above.)   Vital signs are stable and within normal limits.  Patient is asymptomatic currently with a normal neurological exam.  No evidence of ischemia on EKG and head CT is normal as described above.  I provided reassurance to the patient and the husband and offered to do lab work and/or additional imaging including an MRI of the brain, but I explained that her exam was very reassuring at this time and that I found it quite unlikely that lab work would be particular revealing nor that an MRI would show any evidence of any acute abnormalities.  However I did offer twice that I could proceed with any or all of this evaluation.  The patient is very comfortable going home given that she is asymptomatic and just wants to get a good night sleep which I think is very reasonable and appropriate.  I suggested close outpatient follow-up with her primary care doctor and I gave my usual and customary return precautions.         FINAL CLINICAL IMPRESSION(S) / ED DIAGNOSES   Final diagnoses:  Lightheadedness     Rx / DC Orders   ED Discharge Orders     None        Note:  This document was prepared using Dragon voice recognition software and may include unintentional dictation errors.   Loleta Rose, MD 02/16/24 [phone]

## 2024-02-16 NOTE — Discharge Instructions (Signed)
 Your workup in the Emergency Department today was reassuring.  We did not find any specific abnormalities.  We recommend you drink plenty of fluids, take your regular medications and/or any new ones prescribed today, and follow up with the doctor(s) listed in these documents as recommended.  Return to the Emergency Department if you develop new or worsening symptoms that concern you.

## 2024-02-16 NOTE — ED Triage Notes (Addendum)
 Pt reports lightheaded feeling that came on suddenly around 7pm. Felt tired today. 1 episode of vomiting around 9:30pm. Hx of hypertension. Pt denies feeling nauseated currently. Pt reports slight headache started about  noon today. Took allergy pill today. No deficits. Neuro intact
